# Patient Record
Sex: Female | Born: 1995 | Race: White | Hispanic: No | Marital: Married | Smoking: Never smoker
Health system: Southern US, Community
[De-identification: ages and names within clinical notes are randomized; demographics above are authoritative.]

## PROBLEM LIST (undated history)

## (undated) HISTORY — PX: KNEE SURGERY: SHX244

---

## 2016-10-27 ENCOUNTER — Ambulatory Visit (HOSPITAL_COMMUNITY)
Admission: EM | Admit: 2016-10-27 | Discharge: 2016-10-27 | Disposition: A | Discharge status: Home / Self Care | Payer: BLUE CROSS/BLUE SHIELD | Attending: Internal Medicine | Admitting: Internal Medicine

## 2016-10-27 ENCOUNTER — Encounter (HOSPITAL_COMMUNITY): Payer: Self-pay | Admitting: Emergency Medicine

## 2016-10-27 DIAGNOSIS — J020 Streptococcal pharyngitis: Secondary | ICD-10-CM

## 2016-10-27 DIAGNOSIS — J029 Acute pharyngitis, unspecified: Secondary | ICD-10-CM | POA: Diagnosis present

## 2016-10-27 LAB — POCT RAPID STREP A: STREPTOCOCCUS, GROUP A SCREEN (DIRECT): NEGATIVE

## 2016-10-27 MED ORDER — PENICILLIN V POTASSIUM 500 MG PO TABS: 500.0000 mg | ORAL_TABLET | Freq: Two times a day (BID) | ORAL | 0 refills | Status: AC

## 2016-10-27 NOTE — ED Provider Notes (Signed)
CSN: [id number]     Arrival date & time 10/27/16  1728 History   First MD Initiated Contact with Patient 10/27/16 1852     Chief Complaint  Patient presents with  . Sore Throat   (Consider location/radiation/quality/duration/timing/severity/associated sxs/prior Treatment) Been babysitting nephew and niece, who both had pos strep diagnosed 2 days ago.  Reports sore throat is worse on the right side.     The history is provided by the patient.  Sore Throat  This is a new problem. The current episode started yesterday. The problem occurs constantly. The problem has been gradually worsening. Associated symptoms include headaches. Pertinent negatives include no chest pain, no abdominal pain and no shortness of breath. The symptoms are aggravated by swallowing. The symptoms are relieved by NSAIDs.    History reviewed. No pertinent past medical history. Past Surgical History:  Procedure Laterality Date  . KNEE SURGERY Right    History reviewed. No pertinent family history. Social History  Substance Use Topics  . Smoking status: Never Smoker  . Smokeless tobacco: Never Used  . Alcohol use No   OB History    No data available     Review of Systems  Constitutional: Positive for chills and fever.  HENT: Positive for congestion, ear pain, sneezing and sore throat.   Respiratory: Positive for cough. Negative for shortness of breath.   Cardiovascular: Negative for chest pain.  Gastrointestinal: Positive for nausea. Negative for abdominal pain and vomiting.  Neurological: Positive for headaches. Negative for dizziness.    Allergies  Patient has no known allergies.  Home Medications   Prior to Admission medications   Medication Sig Start Date End Date Taking? Authorizing Provider  penicillin v potassium (VEETID) 500 MG tablet Take 1 tablet (500 mg total) by mouth 2 (two) times daily. 10/27/16 11/06/16  Lucia EstelleFeng Kambrie Eddleman, NP   Meds Ordered and Administered this Visit  Medications - No data  to display  BP 118/67 (BP Location: Left Arm)   Pulse 94   Temp 98.4 F (36.9 C) (Oral)   Resp 18   LMP 10/15/2016 (Exact Date)   SpO2 99%  No data found.   Physical Exam  Constitutional: She is oriented to person, place, and time. She appears well-developed and well-nourished.  HENT:  Head: Normocephalic and atraumatic.  Right Ear: External ear normal.  Left Ear: External ear normal.  Nose: Nose normal.  TM pearly gray bilaterally. Left tonsil is 1+, right tonsil 2+ with erythema, no exudate.   Eyes: Pupils are equal, round, and reactive to light.  Neck: Normal range of motion.  Cardiovascular: Normal rate, regular rhythm and normal heart sounds.   Pulmonary/Chest: Effort normal and breath sounds normal. No respiratory distress. She has no wheezes.  Abdominal: Soft. Bowel sounds are normal. She exhibits no distension. There is no tenderness.  Lymphadenopathy:    She has no cervical adenopathy.  Neurological: She is alert and oriented to person, place, and time.  Skin: Skin is warm and dry.  Nursing note and vitals reviewed.   Urgent Care Course   Clinical Course     Procedures (including critical care time)  Labs Review Labs Reviewed  POCT RAPID STREP A    Imaging Review No results found.  MDM   1. Strep throat    Rapid strep is negative. Culture is pending. Patient has had positive strep exposure in the last few days. We will treat empirically with penicillin twice a day 10 days. Informed to follow up with primary  care doctor if sore throat persist or does not improve despite antibiotic. Continue with Motrin for pain relief. May also do salt water gargle, honey, throat lozenges for sore throat relief.     Lucia Estelle, NP 10/27/16 1930

## 2016-10-27 NOTE — ED Triage Notes (Signed)
The patient presented to the [facility] with a complaint of a sore throat x 2 days. The patient denied any fever at home.  

## 2016-10-27 NOTE — Discharge Instructions (Signed)
Rapid strep is negative today. We will send your specimen to the hospital lab for throat culture. We will call you if the throat if your throat culture comes back positive for strep.   Meanwhile, I do want to go ahead and treat you empirically with penicillin twice a day 10 days.

## 2016-10-30 LAB — CULTURE, GROUP A STREP (THRC)

## 2016-12-28 ENCOUNTER — Encounter (HOSPITAL_COMMUNITY): Payer: Self-pay | Admitting: Nurse Practitioner

## 2016-12-28 ENCOUNTER — Emergency Department (HOSPITAL_COMMUNITY)
Admission: EM | Admit: 2016-12-28 | Discharge: 2016-12-29 | Disposition: A | Discharge status: Home / Self Care | Payer: No Typology Code available for payment source | Attending: Emergency Medicine | Admitting: Emergency Medicine

## 2016-12-28 ENCOUNTER — Emergency Department (HOSPITAL_COMMUNITY): Payer: No Typology Code available for payment source

## 2016-12-28 DIAGNOSIS — Y92410 Unspecified street and highway as the place of occurrence of the external cause: Secondary | ICD-10-CM | POA: Insufficient documentation

## 2016-12-28 DIAGNOSIS — Y939 Activity, unspecified: Secondary | ICD-10-CM | POA: Insufficient documentation

## 2016-12-28 DIAGNOSIS — Y999 Unspecified external cause status: Secondary | ICD-10-CM | POA: Insufficient documentation

## 2016-12-28 DIAGNOSIS — S40022A Contusion of left upper arm, initial encounter: Secondary | ICD-10-CM

## 2016-12-28 DIAGNOSIS — S4992XA Unspecified injury of left shoulder and upper arm, initial encounter: Secondary | ICD-10-CM | POA: Diagnosis present

## 2016-12-28 MED ORDER — NAPROXEN 500 MG PO TABS: 500.0000 mg | ORAL_TABLET | Freq: Two times a day (BID) | ORAL | 0 refills | Status: AC

## 2016-12-28 MED ORDER — METHOCARBAMOL 500 MG PO TABS: 500.0000 mg | ORAL_TABLET | Freq: Two times a day (BID) | ORAL | 0 refills | Status: AC

## 2016-12-28 MED ORDER — HYDROCODONE-ACETAMINOPHEN 5-325 MG PO TABS
1.0000 | ORAL_TABLET | Freq: Once | ORAL | Status: AC
  Administered 2016-12-28: 1 via ORAL
  Filled 2016-12-28: qty 1
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
## 2016-12-28 NOTE — Discharge Instructions (Signed)
Sling for comfort. Ice and rest your arm. Follow up with family doctor as needed.
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
